# Patient Record
Sex: Male | Born: 2000 | Race: Black or African American | Hispanic: No | Marital: Single | Smoking: Never smoker
Health system: Southern US, Community
[De-identification: ages and names within clinical notes are randomized; demographics above are authoritative.]

---
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
## 2019-09-07 ENCOUNTER — Ambulatory Visit: Payer: Medicaid Other | Attending: Family

## 2019-09-07 DIAGNOSIS — Z23 Encounter for immunization: Secondary | ICD-10-CM

## 2019-09-07 NOTE — Progress Notes (Signed)
   Covid-19 Vaccination Clinic  Name:  Omar Wise    MRN: [id number] DOB: 06-21-2000  09/07/2019  [name] was observed post Covid-19 immunization for 15 minutes without incident. He was provided with Vaccine Information Sheet and instruction to access the V-Safe system.   [name] was instructed to call 911 with any severe reactions post vaccine: Marland Kitchen Difficulty breathing  . Swelling of face and throat  . A fast heartbeat  . A bad rash all over body  . Dizziness and weakness   Immunizations Administered    Name Date Dose VIS Date Route   Moderna COVID-19 Vaccine 09/07/2019  4:59 PM 0.5 mL 05/16/2019 Intramuscular   Manufacturer: Moderna   Lot: [id number]   NDC: 70962-836-62

## 2019-10-10 ENCOUNTER — Ambulatory Visit: Payer: Medicaid Other | Attending: Family

## 2019-10-10 ENCOUNTER — Ambulatory Visit: Payer: Self-pay

## 2019-10-10 DIAGNOSIS — Z23 Encounter for immunization: Secondary | ICD-10-CM

## 2019-10-10 NOTE — Progress Notes (Signed)
   Covid-19 Vaccination Clinic  Name:  Omar Wise    MRN: [id number] DOB: Mar 18, 2001  10/10/2019  Omar Wise was observed post Covid-19 immunization for 15 minutes without incident. He was provided with Vaccine Information Sheet and instruction to access the V-Safe system.   Omar Wise was instructed to call 911 with any severe reactions post vaccine: Marland Kitchen Difficulty breathing  . Swelling of face and throat  . A fast heartbeat  . A bad rash all over body  . Dizziness and weakness   Immunizations Administered    Name Date Dose VIS Date Route   Moderna COVID-19 Vaccine 10/10/2019  2:35 PM 0.5 mL 05/2019 Intramuscular   Manufacturer: Moderna   Lot: [id number]   NDC: 51102-111-73

## 2021-07-03 ENCOUNTER — Ambulatory Visit: Payer: Self-pay
